# Patient Record
Sex: Male | Born: 1995 | Race: Asian | Hispanic: No | Marital: Married | Smoking: Current every day smoker
Health system: Southern US, Community
[De-identification: ages and names within clinical notes are randomized; demographics above are authoritative.]

## PROBLEM LIST (undated history)

## (undated) DIAGNOSIS — Y249XXA Unspecified firearm discharge, undetermined intent, initial encounter: Secondary | ICD-10-CM

## (undated) DIAGNOSIS — W3400XA Accidental discharge from unspecified firearms or gun, initial encounter: Secondary | ICD-10-CM

## (undated) HISTORY — DX: Unspecified firearm discharge, undetermined intent, initial encounter: Y24.9XXA

## (undated) HISTORY — DX: Accidental discharge from unspecified firearms or gun, initial encounter: W34.00XA

## (undated) HISTORY — PX: OTHER SURGICAL HISTORY: SHX169

---

## 2020-01-20 ENCOUNTER — Other Ambulatory Visit: Payer: Self-pay

## 2020-01-20 ENCOUNTER — Ambulatory Visit (INDEPENDENT_AMBULATORY_CARE_PROVIDER_SITE_OTHER): Payer: Medicaid Other | Admitting: Student

## 2020-01-20 ENCOUNTER — Encounter: Payer: Self-pay | Admitting: Student

## 2020-01-20 VITALS — BP 132/82 | HR 82 | Temp 98.0°F | Ht 70.0 in | Wt 154.8 lb

## 2020-01-20 DIAGNOSIS — W3400XA Accidental discharge from unspecified firearms or gun, initial encounter: Secondary | ICD-10-CM

## 2020-01-20 DIAGNOSIS — T07XXXA Unspecified multiple injuries, initial encounter: Secondary | ICD-10-CM

## 2020-01-20 DIAGNOSIS — Z1159 Encounter for screening for other viral diseases: Secondary | ICD-10-CM

## 2020-01-20 DIAGNOSIS — Z0000 Encounter for general adult medical examination without abnormal findings: Secondary | ICD-10-CM | POA: Insufficient documentation

## 2020-01-20 DIAGNOSIS — F4320 Adjustment disorder, unspecified: Secondary | ICD-10-CM | POA: Diagnosis not present

## 2020-01-20 DIAGNOSIS — Z114 Encounter for screening for human immunodeficiency virus [HIV]: Secondary | ICD-10-CM
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
## 2020-01-20 NOTE — Assessment & Plan Note (Signed)
See overview above for vaccinations. Patient refuses influenza vaccination today. Documentation shows that he received his first dose of Moderna on 12/26/19 and would be due for his second dose 01/23/20. I spoke with patient's sponsor family and they will schedule him a dose at a pharmacy.  - Please be sure patient received second COVID-19 shot at follow up visit and reassess for flu vaccination

## 2020-01-20 NOTE — Patient Instructions (Signed)
Patient is illiterate. Information was hand written for patient to take Naproxen 500mg  twice daily as needed for pain with instruction to sponsor family to call if pain is insufficiently controlled. Instructed patient to call with concerns and to return to clinic for evaluation in about 1.5 months.  , PGY1
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
## 2020-01-21 LAB — CBC WITH DIFFERENTIAL/PLATELET
Basophils Absolute: 0 x10E3/uL (ref 0.0–0.2)
Basos: 0 %
EOS (ABSOLUTE): 0.4 x10E3/uL (ref 0.0–0.4)
Eos: 7 %
Hematocrit: 46.8 % (ref 37.5–51.0)
Hemoglobin: 16.2 g/dL (ref 13.0–17.7)
Immature Grans (Abs): 0 x10E3/uL (ref 0.0–0.1)
Immature Granulocytes: 0 %
Lymphocytes Absolute: 2.7 x10E3/uL (ref 0.7–3.1)
Lymphs: 42 %
MCH: 28.9 pg (ref 26.6–33.0)
MCHC: 34.6 g/dL (ref 31.5–35.7)
MCV: 83 fL (ref 79–97)
Monocytes Absolute: 0.6 x10E3/uL (ref 0.1–0.9)
Monocytes: 9 %
Neutrophils Absolute: 2.7 x10E3/uL (ref 1.4–7.0)
Neutrophils: 42 %
Platelets: 312 x10E3/uL (ref 150–450)
RBC: 5.61 x10E6/uL (ref 4.14–5.80)
RDW: 13.3 % (ref 11.6–15.4)
WBC: 6.4 x10E3/uL (ref 3.4–10.8)

## 2020-01-21 LAB — BMP8+ANION GAP
Anion Gap: 15 mmol/L (ref 10.0–18.0)
BUN/Creatinine Ratio: 14 (ref 9–20)
BUN: 12 mg/dL (ref 6–20)
CO2: 21 mmol/L (ref 20–29)
Calcium: 9.5 mg/dL (ref 8.7–10.2)
Chloride: 100 mmol/L (ref 96–106)
Creatinine, Ser: 0.87 mg/dL (ref 0.76–1.27)
GFR calc Af Amer: 138 mL/min/{1.73_m2} (ref >59)
GFR calc non Af Amer: 119 mL/min/{1.73_m2} (ref >59)
Glucose: 76 mg/dL (ref 65–99)
Potassium: 4.4 mmol/L (ref 3.5–5.2)
Sodium: 136 mmol/L (ref 134–144)

## 2020-01-21 LAB — HIV ANTIBODY (ROUTINE TESTING W REFLEX): HIV Screen 4th Generation wRfx: NONREACTIVE

## 2020-01-21 LAB — HEPATITIS C ANTIBODY: Hep C Virus Ab: 0.1 {s_co_ratio} (ref 0.0–0.9)

## 2020-01-23 ENCOUNTER — Telehealth: Payer: Self-pay | Admitting: Student

## 2020-01-23 NOTE — Telephone Encounter (Signed)
Left a message regarding lab results of Mr. Hildreth and informed Mr. Usey to expect a call from radiology to schedule xrays. Left [facility] contact number for return call if he or patient have any questions or concerns.  Glenford Bayley, PGY1 Internal Medicine [phone]

## 2020-01-24 NOTE — Progress Notes (Signed)
Internal Medicine Clinic Attending  I saw and evaluated the patient.  I personally confirmed the key portions of the history and exam documented by Dr. Speakman and I reviewed pertinent patient test results.  The assessment, diagnosis, and plan were formulated together and I agree with the documentation in the resident's note.  

## 2020-01-27 ENCOUNTER — Telehealth: Payer: Self-pay | Admitting: Student

## 2020-01-27 NOTE — Telephone Encounter (Signed)
Opened in Error.

## 2020-01-30 ENCOUNTER — Other Ambulatory Visit: Payer: Self-pay

## 2020-01-30 ENCOUNTER — Ambulatory Visit (HOSPITAL_COMMUNITY)
Admission: RE | Admit: 2020-01-30 | Discharge: 2020-01-30 | Disposition: A | Discharge status: Home / Self Care | Payer: Medicaid Other | Source: Ambulatory Visit | Attending: Student in an Organized Health Care Education/Training Program | Admitting: Student in an Organized Health Care Education/Training Program

## 2020-01-30 ENCOUNTER — Encounter: Payer: Self-pay | Admitting: Internal Medicine

## 2020-01-30 ENCOUNTER — Ambulatory Visit (INDEPENDENT_AMBULATORY_CARE_PROVIDER_SITE_OTHER): Payer: Medicaid Other | Admitting: Internal Medicine

## 2020-01-30 VITALS — BP 133/91 | HR 69 | Temp 97.7°F | Ht 70.0 in | Wt 159.2 lb

## 2020-01-30 DIAGNOSIS — R29818 Other symptoms and signs involving the nervous system: Secondary | ICD-10-CM

## 2020-01-30 DIAGNOSIS — T07XXXA Unspecified multiple injuries, initial encounter: Secondary | ICD-10-CM

## 2020-01-30 DIAGNOSIS — S81832S Puncture wound without foreign body, left lower leg, sequela: Secondary | ICD-10-CM | POA: Diagnosis present

## 2020-01-30 DIAGNOSIS — W3400XA Accidental discharge from unspecified firearms or gun, initial encounter: Secondary | ICD-10-CM | POA: Insufficient documentation

## 2020-01-30 DIAGNOSIS — M21372 Foot drop, left foot: Secondary | ICD-10-CM | POA: Insufficient documentation

## 2020-01-30 DIAGNOSIS — W3400XS Accidental discharge from unspecified firearms or gun, sequela: Secondary | ICD-10-CM

## 2020-01-30 MED ORDER — GABAPENTIN 300 MG PO CAPS: 300.0000 mg | ORAL_CAPSULE | Freq: Three times a day (TID) | ORAL | 1 refills | Status: DC

## 2020-01-30 NOTE — Patient Instructions (Addendum)
Thank you for allowing [place] to provide your care today. Today we discussed your leg pain    I have ordered no labs for you. I will call if any are abnormal.    Today we made the following changes to your medications.    Please start gabapentin 300mg  TID  Please follow-up if your symptoms worsen  Should you have any questions or concerns please call the internal medicine clinic at [phone].    Gabapentin capsules or tablets What is this medicine? GABAPENTIN (GA ba pen tin) is used to control seizures in certain types of epilepsy. It is also used to treat certain types of nerve pain. This medicine may be used for other purposes; ask your health care provider or pharmacist if you have questions. COMMON BRAND NAME(S): Active-PAC with Gabapentin, Gabarone, Neurontin What should I tell my health care provider before I take this medicine? They need to know if you have any of these conditions:  history of drug abuse or alcohol abuse problem  kidney disease  lung or breathing disease  suicidal thoughts, plans, or attempt; a previous suicide attempt by you or a family member  an unusual or allergic reaction to gabapentin, other medicines, foods, dyes, or preservatives  pregnant or trying to get pregnant  breast-feeding How should I use this medicine? Take this medicine by mouth with a glass of water. Follow the directions on the prescription label. You can take it with or without food. If it upsets your stomach, take it with food. Take your medicine at regular intervals. Do not take it more often than directed. Do not stop taking except on your doctor's advice. If you are directed to break the 600 or 800 mg tablets in half as part of your dose, the extra half tablet should be used for the next dose. If you have not used the extra half tablet within 28 days, it should be thrown away. A special MedGuide will be given to you by the pharmacist with each prescription and refill. Be sure to  read this information carefully each time. Talk to your pediatrician regarding the use of this medicine in children. While this drug may be prescribed for children as young as 3 years for selected conditions, precautions do apply. Overdosage: If you think you have taken too much of this medicine contact a poison control center or emergency room at once. NOTE: This medicine is only for you. Do not share this medicine with others. What if I miss a dose? If you miss a dose, take it as soon as you can. If it is almost time for your next dose, take only that dose. Do not take double or extra doses. What may interact with this medicine? This medicine may interact with the following medications:  alcohol  antihistamines for allergy, cough, and cold  certain medicines for anxiety or sleep  certain medicines for depression like amitriptyline, fluoxetine, sertraline  certain medicines for seizures like phenobarbital, primidone  certain medicines for stomach problems  general anesthetics like halothane, isoflurane, methoxyflurane, propofol  local anesthetics like lidocaine, pramoxine, tetracaine  medicines that relax muscles for surgery  narcotic medicines for pain  phenothiazines like chlorpromazine, mesoridazine, prochlorperazine, thioridazine This list may not describe all possible interactions. Give your health care provider a list of all the medicines, herbs, non-prescription drugs, or dietary supplements you use. Also tell them if you smoke, drink alcohol, or use illegal drugs. Some items may interact with your medicine. What should I watch for while  using this medicine? Visit your doctor or health care provider for regular checks on your progress. You may want to keep a record at home of how you feel your condition is responding to treatment. You may want to share this information with your doctor or health care provider at each visit. You should contact your doctor or health care  provider if your seizures get worse or if you have any new types of seizures. Do not stop taking this medicine or any of your seizure medicines unless instructed by your doctor or health care provider. Stopping your medicine suddenly can increase your seizures or their severity. This medicine may cause serious skin reactions. They can happen weeks to months after starting the medicine. Contact your health care provider right away if you notice fevers or flu-like symptoms with a rash. The rash may be red or purple and then turn into blisters or peeling of the skin. Or, you might notice a red rash with swelling of the face, lips or lymph nodes in your neck or under your arms. Wear a medical identification bracelet or chain if you are taking this medicine for seizures, and carry a card that lists all your medications. You may get drowsy, dizzy, or have blurred vision. Do not drive, use machinery, or do anything that needs mental alertness until you know how this medicine affects you. To reduce dizzy or fainting spells, do not sit or stand up quickly, especially if you are an older patient. Alcohol can increase drowsiness and dizziness. Avoid alcoholic drinks. Your mouth may get dry. Chewing sugarless gum or sucking hard candy, and drinking plenty of water will help. The use of this medicine may increase the chance of suicidal thoughts or actions. Pay special attention to how you are responding while on this medicine. Any worsening of mood, or thoughts of suicide or dying should be reported to your health care provider right away. Women who become pregnant while using this medicine may enroll in the [place] American Antiepileptic Drug Pregnancy Registry by calling [phone]. This registry collects information about the safety of antiepileptic drug use during pregnancy. What side effects may I notice from receiving this medicine? Side effects that you should report to your doctor or health care professional as  soon as possible:  allergic reactions like skin rash, itching or hives, swelling of the face, lips, or tongue  breathing problems  rash, fever, and swollen lymph nodes  redness, blistering, peeling or loosening of the skin, including inside the mouth  suicidal thoughts, mood changes Side effects that usually do not require medical attention (report to your doctor or health care professional if they continue or are bothersome):  dizziness  drowsiness  headache  nausea, vomiting  swelling of ankles, feet, hands  tiredness This list may not describe all possible side effects. Call your doctor for medical advice about side effects. You may report side effects to FDA at 1-800-FDA-1088. Where should I keep my medicine? Keep out of reach of children. This medicine may cause accidental overdose and death if it taken by other adults, children, or pets. Mix any unused medicine with a substance like cat litter or coffee grounds. Then throw the medicine away in a sealed container like a sealed bag or a coffee can with a lid. Do not use the medicine after the expiration date. Store at room temperature between 15 and 30 degrees C (59 and 86 degrees F). NOTE: This sheet is a summary. It may not cover all possible  information. If you have questions about this medicine, talk to your doctor, pharmacist, or health care provider.  2020 Elsevier/Gold Standard (2018-07-02 14:16:43)
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
## 2020-01-31 ENCOUNTER — Telehealth: Payer: Self-pay | Admitting: Internal Medicine

## 2020-01-31 ENCOUNTER — Encounter: Payer: Self-pay | Admitting: Internal Medicine
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
## 2020-01-31 NOTE — Telephone Encounter (Signed)
Spoke with Mr.Shinawari's sponsor, Casimiro Needle, regarding findings of opacity near his tibia, fibula and need for further imaging. He expressed understanding. All other questions and concerns addressed.

## 2020-02-01 ENCOUNTER — Telehealth: Payer: Self-pay | Admitting: *Deleted

## 2020-02-01 DIAGNOSIS — T07XXXA Unspecified multiple injuries, initial encounter: Secondary | ICD-10-CM

## 2020-02-01 MED ORDER — GABAPENTIN 300 MG PO CAPS: 300.0000 mg | ORAL_CAPSULE | Freq: Three times a day (TID) | ORAL | 1 refills | Status: DC

## 2020-02-01 NOTE — Telephone Encounter (Signed)
Call from pt's sponsor, Casimiro Needle, stated pt was seen on Monday by Dr Nedra Hai; pt given rx for Gabapentin and told to continue taking Aleve which he has but the pain is not any better. Pt stated pain @ level #10. Casimiro Needle stated pt's phone # is [phone] and interpreter's # is [phone]. After speaking with pt; Casimiro Needle requesting to be call/inform also so he will be aware of what's going on.
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
## 2020-02-02 NOTE — Progress Notes (Signed)
Internal Medicine Clinic Attending  Case discussed with Dr. Lee  At the time of the visit.  We reviewed the resident's history and exam and pertinent patient test results.  I agree with the assessment, diagnosis, and plan of care documented in the resident's note.    

## 2020-02-02 NOTE — Telephone Encounter (Signed)
Spoke with Casimiro Needle P.(Caretaker). Telehealth appt has been sch for 02/09/2020 with Dr. Nedra Hai.   No referral has been placed for this patient o see a neurologist yet.  The patient is self pay and can not pay out of pocket to see a neurologist.  The caretaker is aware of his sch appt with the [facility] on Monday 02/06/2020.

## 2020-02-06 ENCOUNTER — Ambulatory Visit: Payer: Self-pay

## 2020-02-08 ENCOUNTER — Other Ambulatory Visit: Payer: Self-pay
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
## 2020-02-09 ENCOUNTER — Encounter: Payer: Self-pay | Admitting: Internal Medicine

## 2020-02-09 ENCOUNTER — Ambulatory Visit: Payer: Self-pay | Admitting: Internal Medicine

## 2020-02-09 ENCOUNTER — Other Ambulatory Visit: Payer: Self-pay

## 2020-02-09 DIAGNOSIS — T07XXXA Unspecified multiple injuries, initial encounter: Secondary | ICD-10-CM

## 2020-02-09 NOTE — Progress Notes (Signed)
  [facility] Internal Medicine Residency Telephone Encounter Continuity Care Appointment  HPI:   This telephone encounter was created for Mr. Yordin Baehr on 02/09/2020 for the following purpose/cc foot drop.  Spoke with Ms.[name] Deboraha Sprang, one of Mr.Kory Azimi's sponsor regarding foot drop. He mentions that he continues to endorse foot pain and foot drop. He had increased his gabapentin dose without significant improvement in his symptoms. He mentions that her son is a neurologist in [place] and had shown him multiple exercise regimens for rehab which he has been doing with some improvement in his symptoms. She mentions that they would like to expedite the referral process for him to be seen by a neurologist locally and mentions being recommended Dr.Willis.    Past Medical History:   No past medical history on file.      Unable to provide ROS   Assessment / Plan / Recommendations:   Please see A&P under problem oriented charting for assessment of the patient's acute and chronic medical conditions.   As always, pt is advised that if symptoms worsen or new symptoms arise, they should go to an urgent care facility or to to ER for further evaluation.   Consent and Medical Decision Making:   Patient discussed with Dr. Oswaldo Done  This is a telephone encounter between [facility] and Theotis Barrio on 02/09/2020 for foot drop. The visit was conducted with the patient located at home and Theotis Barrio at [facility]. The patient's identity was confirmed using their DOB and current address. The his/her legal guardian has consented to being evaluated through a telephone encounter and understands the associated risks (an examination cannot be done and the patient may need to come in for an appointment) / benefits (allows the patient to remain at home, decreasing exposure to coronavirus). I personally spent 13 minutes on medical discussion.
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
## 2020-02-14 NOTE — Congregational Nurse Program (Signed)
  Dept: [phone]   Congregational Nurse Program Note  Date of Encounter: 02/14/2020  Past Medical History: No past medical history on file.  Encounter details: Client came in for blood pressure check.Same done. He is taking medication as prescribed but still c/o pain to left leg. He is waiting to see neurologist.I will continue to follow. Arman Bogus RN BSN PCCN  [facility] [phone]-Office [phone]-cell

## 2020-02-21 ENCOUNTER — Other Ambulatory Visit: Payer: Self-pay

## 2020-02-21 DIAGNOSIS — W3400XD Accidental discharge from unspecified firearms or gun, subsequent encounter: Secondary | ICD-10-CM

## 2020-02-21 LAB — GLUCOSE, POCT (MANUAL RESULT ENTRY): POC Glucose: 79 mg/dl (ref 70–99)

## 2020-02-21 NOTE — Congregational Nurse Program (Signed)
  Dept: [phone]   Congregational Nurse Program Note  Date of Encounter: 02/21/2020  Past Medical History: No past medical history on file.  Encounter Details:  Client came in from [place] classroom c/o severe pain left foot. Rated 10/10. I have reviewed his medication. He is taking Gabapentin as ordered.  Noted appointment that is scheduled with neurologist on 02/23/20. Advised patient to continue with Gabapentin as ordered although he says not helping. Warm compresses and massage. Nicole Cella Nosson Wender RN BSN PCCn  [facility] [phone]-office [phone]-cell

## 2020-02-23 ENCOUNTER — Ambulatory Visit: Payer: Medicaid Other | Admitting: Neurology

## 2020-02-23 ENCOUNTER — Encounter: Payer: Self-pay | Admitting: Neurology

## 2020-02-23 VITALS — BP 111/69 | HR 81

## 2020-02-23 DIAGNOSIS — S7402XS Injury of sciatic nerve at hip and thigh level, left leg, sequela: Secondary | ICD-10-CM | POA: Diagnosis not present

## 2020-02-23 DIAGNOSIS — M21372 Foot drop, left foot: Secondary | ICD-10-CM | POA: Diagnosis not present

## 2020-02-23 DIAGNOSIS — T07XXXA Unspecified multiple injuries, initial encounter: Secondary | ICD-10-CM

## 2020-02-23 DIAGNOSIS — S7402XA Injury of sciatic nerve at hip and thigh level, left leg, initial encounter: Secondary | ICD-10-CM | POA: Insufficient documentation

## 2020-02-23 MED ORDER — GABAPENTIN 300 MG PO CAPS
900.0000 mg | ORAL_CAPSULE | Freq: Three times a day (TID) | ORAL | 11 refills | Status: DC
Start: 2020-02-23 — End: 2020-03-12
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
## 2020-02-27 ENCOUNTER — Telehealth: Payer: Self-pay | Admitting: Neurology

## 2020-02-27 NOTE — Telephone Encounter (Signed)
self pay order sent to GI. They will reach out to the patient to schedule.  °

## 2020-02-28 ENCOUNTER — Telehealth: Payer: Self-pay | Admitting: Neurology

## 2020-02-28 MED ORDER — PREGABALIN 100 MG PO CAPS: 100.0000 mg | ORAL_CAPSULE | Freq: Three times a day (TID) | ORAL | 5 refills | Status: DC

## 2020-02-28 NOTE — Telephone Encounter (Addendum)
Meds ordered this encounter  Medications  . pregabalin (LYRICA) 100 MG capsule    Sig: Take 1 capsule (100 mg total) by mouth 3 (three) times daily.    Dispense:  90 capsule    Refill:  5    Please taper off gabapentin 300mg  2 tabs tid for one week, then one tab tid for one week, stop.  Then start Lyrica 100mg  tid

## 2020-02-28 NOTE — Telephone Encounter (Signed)
Donald Harrison is not on DPR but he called on behalf of Donald Harrison to ask if there is another medication that can be given to pt. for pain before next appt. Please advise.  [name]'s contact number: [phone]

## 2020-02-28 NOTE — Telephone Encounter (Signed)
Angie with [facility] called regarding prescription for Lyrica.  Went over Dr. Zannie Cove instructions for tapering down the gabapentin (2 tabs 3xday x 1 week then 1 tab 3x/day x 1 week then stop, then start the Lyrica).  She verbalized acknowledgement of instructions said she will include that on the prescription .

## 2020-02-28 NOTE — Addendum Note (Signed)
Addended by: Levert Feinstein on: 02/28/2020 03:25 PM   Modules accepted: Orders
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
## 2020-03-12 ENCOUNTER — Telehealth: Payer: Self-pay | Admitting: Neurology

## 2020-03-12 ENCOUNTER — Ambulatory Visit (INDEPENDENT_AMBULATORY_CARE_PROVIDER_SITE_OTHER): Payer: Medicaid Other | Admitting: Neurology

## 2020-03-12 ENCOUNTER — Telehealth: Payer: Self-pay | Admitting: *Deleted

## 2020-03-12 ENCOUNTER — Ambulatory Visit: Payer: Medicaid Other | Admitting: Neurology

## 2020-03-12 DIAGNOSIS — M21372 Foot drop, left foot: Secondary | ICD-10-CM | POA: Diagnosis not present

## 2020-03-12 DIAGNOSIS — S7402XS Injury of sciatic nerve at hip and thigh level, left leg, sequela: Secondary | ICD-10-CM

## 2020-03-12 MED ORDER — PREGABALIN 100 MG PO CAPS
100.0000 mg | ORAL_CAPSULE | Freq: Three times a day (TID) | ORAL | 5 refills | Status: DC
Start: 2020-03-12 — End: 2020-04-04

## 2020-03-12 NOTE — Telephone Encounter (Signed)
The patient is currently taking gabapentin 300mg , three capsules TID. He had a NCV/EMG today. Dr. is going to change him to Lyrica 100mg , one capsule TID. His sponsor and caregiver, Terrace [place], called with concerns that the slow transition from gabapentin to pregabalin may cause the patient several weeks of worsening pain. She is asking if he can switch over to the new medication faster. Per vo by Dr. , he can stop the gabapentin without tapering the dose and start the pregabalin. She did provide caution that the patient may experience fatigue, dizziness and some increased pain with this quick change. His caregiver voiced understanding and will explain it to the patient. She will call back with any further concerns.

## 2020-03-12 NOTE — Telephone Encounter (Signed)
error 
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
## 2020-03-15 ENCOUNTER — Other Ambulatory Visit: Payer: Self-pay

## 2020-03-15 ENCOUNTER — Ambulatory Visit
Admission: RE | Admit: 2020-03-15 | Discharge: 2020-03-15 | Disposition: A | Discharge status: Home / Self Care | Payer: Medicaid Other | Source: Ambulatory Visit | Attending: Neurology | Admitting: Neurology

## 2020-03-15 DIAGNOSIS — S7402XS Injury of sciatic nerve at hip and thigh level, left leg, sequela: Secondary | ICD-10-CM

## 2020-03-15 MED ORDER — GADOBENATE DIMEGLUMINE 529 MG/ML IV SOLN
15.0000 mL | Freq: Once | INTRAVENOUS | Status: AC | PRN
  Administered 2020-03-15: 15 mL via INTRAVENOUS

## 2020-03-19 ENCOUNTER — Telehealth: Payer: Self-pay | Admitting: Neurology

## 2020-03-19 ENCOUNTER — Encounter: Payer: Self-pay | Admitting: Physical Medicine & Rehabilitation

## 2020-03-19 DIAGNOSIS — S7402XS Injury of sciatic nerve at hip and thigh level, left leg, sequela: Secondary | ICD-10-CM

## 2020-03-19 NOTE — Telephone Encounter (Signed)
I have spoke with the patient's sponsor, [employer] She is aware of his MRI femur results. She is in agreement to the orthopaedic referral.  1) Orthopaedic referral placed 2) [facility] referral - [facility] did not accept Medicaid - needs it sent to different practice  Also, she said her son, Dr. Molli Barrows, called last week and left a voicemail for medical records to send him this patient's information. He is assisting with the patient's care. She would like the MRI to be included in the records.   For any questions, the patient's sponsor/caregiver speaks english and is on his DPR. Her name is Avon Gully [phone].

## 2020-03-19 NOTE — Telephone Encounter (Signed)
Centered 21 cm above the joint line of the knee, the sciatic nerve is markedly thickened measuring 1.6 cm in diameter with abnormal postcontrast enhancement. The abnormality extends approximately 7 cm craniocaudal. No frank disruption is seen. Distal to the site of injury, the nerve demonstrates markedly abnormal increased T2 signal.  IMPRESSION: High-grade injury of the sciatic nerve as described above has an appearance most compatible with a grade 4 or 5 injury under the Sunderlin grading system. The nerve demonstrates grossly abnormal signal distal to the injury.  Please call patient, there is evidence of left sciatic nerve injury, with marked abnormal increased T2 signal, at the sciatic nerve, extending 7 cm (21 cm above the knee joint line).  There is no frank disruption,  I may refer him to orthopedic surgeon to see if there are any chance for some intervention

## 2020-03-20 NOTE — Telephone Encounter (Signed)
Referral has been sent to [facility] and [facility] Because of insurance and I spoke to care giver and explained all details so she is aware. Thanks So much .

## 2020-03-26 ENCOUNTER — Ambulatory Visit (INDEPENDENT_AMBULATORY_CARE_PROVIDER_SITE_OTHER): Payer: Medicaid Other | Admitting: Orthopedic Surgery

## 2020-03-26 DIAGNOSIS — M25562 Pain in left knee: Secondary | ICD-10-CM | POA: Diagnosis not present

## 2020-03-26 DIAGNOSIS — T07XXXA Unspecified multiple injuries, initial encounter: Secondary | ICD-10-CM

## 2020-03-27 ENCOUNTER — Encounter: Payer: Self-pay | Admitting: Orthopedic Surgery
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
## 2020-04-04 ENCOUNTER — Telehealth: Payer: Self-pay | Admitting: *Deleted

## 2020-04-04 DIAGNOSIS — M792 Neuralgia and neuritis, unspecified: Secondary | ICD-10-CM

## 2020-04-04 DIAGNOSIS — S7402XS Injury of sciatic nerve at hip and thigh level, left leg, sequela: Secondary | ICD-10-CM

## 2020-04-04 MED ORDER — PREGABALIN 200 MG PO CAPS: 200.0000 mg | ORAL_CAPSULE | Freq: Three times a day (TID) | ORAL | 5 refills | Status: AC

## 2020-04-04 MED ORDER — DULOXETINE HCL 60 MG PO CPEP: 60.0000 mg | ORAL_CAPSULE | Freq: Every day | ORAL | 12 refills | Status: AC

## 2020-04-04 NOTE — Telephone Encounter (Signed)
[name] returned my call. Says the patient's pain is not being well controlled on pregabalin 100mg , one tablet TID. He is requesting an increase of the medication.   He has appt w/ pain management on 04/12/20.  However, she is saying the patient would like to seen by Dr. 04/14/20 at [facility] in [place]. States the office accepts Medicaid.

## 2020-04-04 NOTE — Addendum Note (Signed)
Addended by: Levert Feinstein on: 04/04/2020 03:52 PM   Modules accepted: Orders

## 2020-04-04 NOTE — Telephone Encounter (Signed)
We received a fax from [facility] stating the patient is requesting an increase of pregabalin. He is currently taking 100mg  TID.   I called his caregiver/sponsor, (on DPR) who speaks english. I left a message requesting a call back to get further information.  He has been referred to pain management but his initial appt is not until 04/12/20.

## 2020-04-04 NOTE — Telephone Encounter (Signed)
I have talked with his care giver Avon Gully,  Patient is in significant pain taking Lyrica 100mg  tid, no significant side effect noted.  1. Will increase Lyrica to 200mg  tid 2. Add on Cymbalta 60mg  qday  Meds ordered this encounter  Medications  . pregabalin (LYRICA) 200 MG capsule    Sig: Take 1 capsule (200 mg total) by mouth 3 (three) times daily.    Dispense:  90 capsule    Refill:  5  . DULoxetine (CYMBALTA) 60 MG capsule    Sig: Take 1 capsule (60 mg total) by mouth daily.    Dispense:  30 capsule    Refill:  12

## 2020-04-04 NOTE — Telephone Encounter (Signed)
Faxed referral for Dr. Barrie Dunker at [facility]

## 2020-04-04 NOTE — Addendum Note (Signed)
Addended by: Levert Feinstein on: 04/04/2020 03:54 PM   Modules accepted: Orders

## 2020-04-12 ENCOUNTER — Encounter: Payer: Medicaid Other | Admitting: Physical Medicine & Rehabilitation
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
## 2020-06-07 ENCOUNTER — Ambulatory Visit: Payer: Medicaid Other | Attending: Neurology | Admitting: Physical Therapy

## 2020-06-07 ENCOUNTER — Other Ambulatory Visit: Payer: Self-pay

## 2020-06-07 DIAGNOSIS — R2681 Unsteadiness on feet: Secondary | ICD-10-CM | POA: Insufficient documentation

## 2020-06-07 DIAGNOSIS — M6281 Muscle weakness (generalized): Secondary | ICD-10-CM | POA: Insufficient documentation

## 2020-06-07 DIAGNOSIS — R2689 Other abnormalities of gait and mobility: Secondary | ICD-10-CM | POA: Diagnosis present

## 2020-06-08 ENCOUNTER — Encounter: Payer: Self-pay | Admitting: Physical Therapy
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
## 2020-06-12 ENCOUNTER — Ambulatory Visit: Payer: Medicaid Other | Attending: Neurology | Admitting: Physical Therapy

## 2020-06-12 ENCOUNTER — Other Ambulatory Visit: Payer: Self-pay

## 2020-06-12 ENCOUNTER — Encounter: Payer: Self-pay | Admitting: Physical Therapy

## 2020-06-12 DIAGNOSIS — R2689 Other abnormalities of gait and mobility: Secondary | ICD-10-CM | POA: Diagnosis present

## 2020-06-12 DIAGNOSIS — R2681 Unsteadiness on feet: Secondary | ICD-10-CM | POA: Diagnosis present

## 2020-06-12 DIAGNOSIS — M6281 Muscle weakness (generalized): Secondary | ICD-10-CM | POA: Insufficient documentation

## 2020-06-12 NOTE — Patient Instructions (Signed)
Access Code: [email or web address] URL: [email or web address] Date: 06/12/2020 Prepared by: Sallyanne Kuster  Program Notes Do no do these if they cause pain.  Move slowly with all of the exercises.    Exercises Supine Bridge - 1 x daily - 5 x weekly - 1 sets - 10 reps - 5 hold Sit to/from Stand in Stride position - 1 x daily - 5 x weekly - 1 sets - 10 reps Standing Hip Abduction with Resistance at Ankles and Counter Support - 1 x daily - 5 x weekly - 1 sets - 10 reps Standing Hip Extension with Resistance at Ankles and Counter Support - 1 x daily - 5 x weekly - 1 sets - 10 reps
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
## 2020-06-14 ENCOUNTER — Telehealth: Payer: Self-pay | Admitting: Physical Therapy

## 2020-06-14 NOTE — Telephone Encounter (Signed)
Trial of e-stem sounds great, he is missing most of the anterior compartment muscles and about 1/4 of gastroc and soleus
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
## 2020-06-19 ENCOUNTER — Other Ambulatory Visit: Payer: Self-pay

## 2020-06-19 ENCOUNTER — Ambulatory Visit: Payer: Medicaid Other | Admitting: Physical Therapy

## 2020-06-19 ENCOUNTER — Encounter: Payer: Self-pay | Admitting: Physical Therapy

## 2020-06-19 DIAGNOSIS — R2681 Unsteadiness on feet: Secondary | ICD-10-CM

## 2020-06-19 DIAGNOSIS — M6281 Muscle weakness (generalized): Secondary | ICD-10-CM | POA: Diagnosis not present

## 2020-06-19 DIAGNOSIS — R2689 Other abnormalities of gait and mobility: Secondary | ICD-10-CM
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
## 2020-06-26 ENCOUNTER — Encounter: Payer: Self-pay | Admitting: Physical Therapy

## 2020-06-26 ENCOUNTER — Ambulatory Visit: Payer: Medicaid Other | Admitting: Physical Therapy

## 2020-06-26 ENCOUNTER — Other Ambulatory Visit: Payer: Self-pay

## 2020-06-26 DIAGNOSIS — M6281 Muscle weakness (generalized): Secondary | ICD-10-CM

## 2020-06-26 DIAGNOSIS — R2689 Other abnormalities of gait and mobility: Secondary | ICD-10-CM

## 2020-06-26 DIAGNOSIS — R2681 Unsteadiness on feet: Secondary | ICD-10-CM
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
## 2020-07-03 ENCOUNTER — Ambulatory Visit: Payer: Medicaid Other | Admitting: Physical Therapy

## 2020-07-03 ENCOUNTER — Encounter: Payer: Self-pay | Admitting: Physical Therapy

## 2020-07-03 ENCOUNTER — Other Ambulatory Visit: Payer: Self-pay

## 2020-07-03 DIAGNOSIS — R2681 Unsteadiness on feet: Secondary | ICD-10-CM

## 2020-07-03 DIAGNOSIS — R2689 Other abnormalities of gait and mobility: Secondary | ICD-10-CM

## 2020-07-03 DIAGNOSIS — M6281 Muscle weakness (generalized): Secondary | ICD-10-CM
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
## 2020-07-10 ENCOUNTER — Ambulatory Visit: Payer: Medicaid Other | Admitting: Physical Therapy

## 2020-07-17 ENCOUNTER — Ambulatory Visit: Payer: Medicaid Other | Attending: Neurology | Admitting: Physical Therapy

## 2020-07-17 ENCOUNTER — Other Ambulatory Visit: Payer: Self-pay

## 2020-07-17 ENCOUNTER — Encounter: Payer: Self-pay | Admitting: Physical Therapy

## 2020-07-17 DIAGNOSIS — R2681 Unsteadiness on feet: Secondary | ICD-10-CM | POA: Insufficient documentation

## 2020-07-17 DIAGNOSIS — M6281 Muscle weakness (generalized): Secondary | ICD-10-CM | POA: Diagnosis not present

## 2020-07-17 DIAGNOSIS — R2689 Other abnormalities of gait and mobility: Secondary | ICD-10-CM | POA: Insufficient documentation

## 2020-07-17 NOTE — Congregational Nurse Program (Signed)
Patient came in for blood pressure check. Clothing donations provided.  Arman Bogus RN BSn PCCN  [facility] [phone]-cell [phone]-office
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
## 2020-07-24 ENCOUNTER — Ambulatory Visit: Payer: Medicaid Other | Admitting: Physical Therapy

## 2020-07-31 ENCOUNTER — Other Ambulatory Visit: Payer: Self-pay

## 2020-07-31 ENCOUNTER — Ambulatory Visit: Payer: Medicaid Other | Admitting: Physical Therapy

## 2020-07-31 DIAGNOSIS — R2689 Other abnormalities of gait and mobility: Secondary | ICD-10-CM

## 2020-07-31 DIAGNOSIS — R2681 Unsteadiness on feet: Secondary | ICD-10-CM

## 2020-07-31 DIAGNOSIS — M6281 Muscle weakness (generalized): Secondary | ICD-10-CM
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
## 2020-08-07 ENCOUNTER — Encounter: Payer: Self-pay | Admitting: Physical Therapy

## 2020-08-07 ENCOUNTER — Ambulatory Visit: Payer: Medicaid Other | Admitting: Physical Therapy

## 2020-08-07 ENCOUNTER — Other Ambulatory Visit: Payer: Self-pay

## 2020-08-07 DIAGNOSIS — R2681 Unsteadiness on feet: Secondary | ICD-10-CM

## 2020-08-07 DIAGNOSIS — M6281 Muscle weakness (generalized): Secondary | ICD-10-CM

## 2020-08-07 DIAGNOSIS — R2689 Other abnormalities of gait and mobility: Secondary | ICD-10-CM
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
## 2020-08-14 ENCOUNTER — Ambulatory Visit: Payer: Medicaid Other | Attending: Neurology | Admitting: Physical Therapy

## 2020-08-14 ENCOUNTER — Telehealth: Payer: Self-pay | Admitting: Physical Therapy

## 2020-08-14 NOTE — Telephone Encounter (Signed)
Spoke with patient today via interpreter, as he missed 2 pm appointment today and this is his last scheduled PT appt.  Pt apologetic, reporting he did not know of this appointment.  He states he is out of town and will call back (via interpreter) when he gets back to schedule more appointments.    Also, this PT spoke with Rayburn Ma at [employer] today, regarding pt's AFO needing repair.  She states he is not an active patient and she needs [facility] insurance number to activate in order to contact/schedule patient.  PT unable to ask this of patient today on the phone.  [name], PT 08/14/20 3:13 PM Phone: [phone] Fax: [phone]

## 2020-10-05 ENCOUNTER — Ambulatory Visit: Payer: BC Managed Care – PPO | Attending: Neurology | Admitting: Physical Therapy

## 2020-10-05 ENCOUNTER — Other Ambulatory Visit: Payer: Self-pay

## 2020-10-05 DIAGNOSIS — R2689 Other abnormalities of gait and mobility: Secondary | ICD-10-CM | POA: Diagnosis present

## 2020-10-05 DIAGNOSIS — M6281 Muscle weakness (generalized): Secondary | ICD-10-CM | POA: Diagnosis present

## 2020-10-05 DIAGNOSIS — R2681 Unsteadiness on feet: Secondary | ICD-10-CM | POA: Diagnosis present
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
## 2020-10-16 ENCOUNTER — Ambulatory Visit: Payer: Medicaid Other | Attending: Neurology | Admitting: Physical Therapy

## 2020-10-16 ENCOUNTER — Encounter: Payer: Self-pay | Admitting: *Deleted

## 2020-10-26 ENCOUNTER — Ambulatory Visit: Payer: Medicaid Other | Admitting: Physical Therapy

## 2020-10-30 ENCOUNTER — Ambulatory Visit: Payer: Medicaid Other | Admitting: Physical Therapy

## 2020-11-06 ENCOUNTER — Ambulatory Visit: Payer: Medicaid Other | Admitting: Physical Therapy

## 2020-11-13 ENCOUNTER — Encounter: Payer: Self-pay | Admitting: Physical Therapy

## 2020-11-13 ENCOUNTER — Telehealth: Payer: Self-pay | Admitting: Physical Therapy

## 2020-11-13 ENCOUNTER — Ambulatory Visit: Payer: BC Managed Care – PPO | Attending: Neurology | Admitting: Physical Therapy

## 2020-11-13 NOTE — Therapy (Signed)
[facility] [address] [place], [place], [address] Phone: [phone]   Fax:  [phone]  Patient Details  Name: Donald Harrison MRN: [id number] Date of Birth: 23-Nov-1995 Referring Provider:  No ref. provider found  Encounter Date: 11/13/2020  PHYSICAL THERAPY DISCHARGE SUMMARY  Visits from Start of Care: 9  Current functional level related to goals / functional outcomes: See note 10/05/20   Remaining deficits: Weakness and pain   Education / Equipment: HEP was initiated; unable to fully address, as pt has had multiple no-shows and missed therapy appointments.   Patient agrees to discharge. Patient goals were not met. Patient is being discharged due to not returning since the last visit.  Pt has been for a total of 9 PT visits since initial evaluation; however, pt had long break of being away from therapy from [facility] June; then pt returned for re-eval 10/05/20 and has not returned since that visit.  Discharge at this time.    Donald Yager W. 11/13/2020, 2:51 PM Frazier Butt., PT   [facility] [address] [place], [place], [address] Phone: [phone]   Fax:  [phone]

## 2020-11-13 NOTE — Telephone Encounter (Signed)
Attempted to contact patient regarding missed PT appointments.  Phone number on chart is not working.  Spoke with front office staff to request taking out remaining appointments, as pt has not been seen since 10/05/20.  Plan is to discharge patient at this time and pt will need new MD orders to return for therapy.  [name], PT 11/13/20 2:49 PM Phone: [phone] Fax: [phone]

## 2020-11-21 ENCOUNTER — Ambulatory Visit: Payer: Medicaid Other | Admitting: Physical Therapy
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
